# Patient Record
Sex: Male | Born: 1940 | Race: White | Hispanic: No | Marital: Married | Smoking: Never smoker
Health system: Southern US, Community
[De-identification: ages and names within clinical notes are randomized; demographics above are authoritative.]

## PROBLEM LIST (undated history)

## (undated) DIAGNOSIS — H9190 Unspecified hearing loss, unspecified ear: Secondary | ICD-10-CM

## (undated) DIAGNOSIS — N486 Induration penis plastica: Secondary | ICD-10-CM

## (undated) DIAGNOSIS — K219 Gastro-esophageal reflux disease without esophagitis: Secondary | ICD-10-CM

## (undated) DIAGNOSIS — E785 Hyperlipidemia, unspecified: Secondary | ICD-10-CM

## (undated) DIAGNOSIS — Z85820 Personal history of malignant melanoma of skin: Secondary | ICD-10-CM

## (undated) DIAGNOSIS — N182 Chronic kidney disease, stage 2 (mild): Secondary | ICD-10-CM

## (undated) DIAGNOSIS — Z9989 Dependence on other enabling machines and devices: Secondary | ICD-10-CM

## (undated) DIAGNOSIS — Z8639 Personal history of other endocrine, nutritional and metabolic disease: Secondary | ICD-10-CM

## (undated) DIAGNOSIS — G4733 Obstructive sleep apnea (adult) (pediatric): Secondary | ICD-10-CM

## (undated) DIAGNOSIS — N400 Enlarged prostate without lower urinary tract symptoms: Secondary | ICD-10-CM

## (undated) DIAGNOSIS — H401134 Primary open-angle glaucoma, bilateral, indeterminate stage: Secondary | ICD-10-CM

## (undated) DIAGNOSIS — E559 Vitamin D deficiency, unspecified: Secondary | ICD-10-CM

## (undated) DIAGNOSIS — Z961 Presence of intraocular lens: Secondary | ICD-10-CM

## (undated) DIAGNOSIS — I129 Hypertensive chronic kidney disease with stage 1 through stage 4 chronic kidney disease, or unspecified chronic kidney disease: Secondary | ICD-10-CM

## (undated) HISTORY — DX: Gastro-esophageal reflux disease without esophagitis: K21.9

## (undated) HISTORY — DX: Hypertensive chronic kidney disease with stage 1 through stage 4 chronic kidney disease, or unspecified chronic kidney disease: I12.9

## (undated) HISTORY — DX: Induration penis plastica: N48.6

## (undated) HISTORY — DX: Primary open-angle glaucoma, bilateral, indeterminate stage: H40.1134

## (undated) HISTORY — DX: Benign prostatic hyperplasia without lower urinary tract symptoms: N40.0

## (undated) HISTORY — DX: Unspecified hearing loss, unspecified ear: H91.90

## (undated) HISTORY — PX: BASAL CELL CARCINOMA EXCISION: SHX1214

## (undated) HISTORY — DX: Personal history of other endocrine, nutritional and metabolic disease: Z86.39

## (undated) HISTORY — DX: Chronic kidney disease, stage 2 (mild): N18.2

## (undated) HISTORY — DX: Obstructive sleep apnea (adult) (pediatric): G47.33

## (undated) HISTORY — PX: APPENDECTOMY: SHX54

## (undated) HISTORY — PX: CATARACT EXTRACTION, BILATERAL: SHX1313

## (undated) HISTORY — DX: Dependence on other enabling machines and devices: Z99.89

## (undated) HISTORY — DX: Vitamin D deficiency, unspecified: E55.9

## (undated) HISTORY — DX: Personal history of malignant melanoma of skin: Z85.820

## (undated) HISTORY — DX: Hyperlipidemia, unspecified: E78.5

## (undated) HISTORY — DX: Presence of intraocular lens: Z96.1

---

## 1983-10-28 HISTORY — PX: ROTATOR CUFF REPAIR: SHX139

## 2001-09-28 ENCOUNTER — Encounter: Admission: RE | Admit: 2001-09-28 | Discharge: 2001-09-28 | Payer: Self-pay | Admitting: Family Medicine

## 2001-09-28 ENCOUNTER — Encounter: Payer: Self-pay | Admitting: Family Medicine

## 2001-09-28 ENCOUNTER — Ambulatory Visit (HOSPITAL_COMMUNITY): Admission: RE | Admit: 2001-09-28 | Discharge: 2001-09-28 | Payer: Self-pay | Admitting: Family Medicine

## 2002-11-17 ENCOUNTER — Encounter (INDEPENDENT_AMBULATORY_CARE_PROVIDER_SITE_OTHER): Payer: Self-pay | Admitting: *Deleted

## 2002-11-17 ENCOUNTER — Ambulatory Visit (HOSPITAL_BASED_OUTPATIENT_CLINIC_OR_DEPARTMENT_OTHER): Admission: RE | Admit: 2002-11-17 | Discharge: 2002-11-17 | Payer: Self-pay | Admitting: Orthopedic Surgery

## 2004-04-15 ENCOUNTER — Emergency Department (HOSPITAL_COMMUNITY): Admission: EM | Admit: 2004-04-15 | Discharge: 2004-04-16 | Payer: Self-pay | Admitting: Emergency Medicine

## 2004-05-16 ENCOUNTER — Encounter: Admission: RE | Admit: 2004-05-16 | Discharge: 2004-05-16 | Payer: Self-pay | Admitting: Family Medicine

## 2006-11-10 ENCOUNTER — Ambulatory Visit (HOSPITAL_BASED_OUTPATIENT_CLINIC_OR_DEPARTMENT_OTHER): Admission: RE | Admit: 2006-11-10 | Discharge: 2006-11-10 | Payer: Self-pay | Admitting: Family Medicine

## 2007-05-28 HISTORY — PX: HAND SURGERY: SHX662

## 2009-09-12 ENCOUNTER — Ambulatory Visit (HOSPITAL_BASED_OUTPATIENT_CLINIC_OR_DEPARTMENT_OTHER): Admission: RE | Admit: 2009-09-12 | Discharge: 2009-09-12 | Payer: Self-pay | Admitting: Orthopedic Surgery

## 2011-01-29 LAB — POCT HEMOGLOBIN-HEMACUE: Hemoglobin: 14.3 g/dL (ref 13.0–17.0)

## 2011-03-14 NOTE — Procedures (Signed)
NAME:  Andrew Willis, Andrew Willis NO.:  [id number]   MEDICAL RECORD NO.:  [id number]          PATIENT TYPE:  OUT   LOCATION:  SLEEP CENTER                 FACILITY:  [facility]   PHYSICIAN:  Clinton D. Maple Hudson, MD, FCCP, FACPDATE OF BIRTH:  Aug 23[birth year] 1940   DATE OF STUDY:  11/10/2006                            NOCTURNAL POLYSOMNOGRAM   REFERRING PHYSICIAN:  Bradd Canary MD   INDICATION FOR STUDY:  Hypersomnia with sleep apnea.   EPWORTH SLEEPINESS SCORE:  9/24, BMI 23.7, weight 170 pounds.   MEDICATIONS:  Home medications are listed and reviewed.   SLEEP ARCHITECTURE:  Total sleep time 331 minutes with sleep efficiency  74.  Stage I was 11%, stage II 79%, stages III and IV absent, REM 10% of  total sleep time.  Sleep latency 6 minutes, REM latency 270 minutes,  awake after sleep onset 109 minutes, arousal index 15.4.  No bedtime  medications were taken.   RESPIRATORY DATA:  Split study protocol.  Apnea-hypopnea index (AHI,  RDI) 22.7 obstructive events per hour, indicating moderate obstructive  sleep apnea/hypopnea syndrome before CPAP.  This included 47 obstructive  apneas, 4 hypopneas before CPAP.  Events were not positional, although  somewhat more common while supine as expected.  REM AHI 18.7 per hour.  The CPAP was titrated to 12 CWP, AHI 0 per hour.  A medium ResMed Ultra  Mirage full face mask was used with a heated humidifier.   OXYGEN DATA:  Moderate snoring with oxygen desaturation to a nadir of  88% before CPAP.  After CPAP control, saturation held 96% on room air.   CARDIAC DATA:  Sinus rhythm with occasional PAC.   MOVEMENT-PARASOMNIA:  Occasional limb jerk with arousal, insignificant.  Bathroom x3.   IMPRESSIONS-RECOMMENDATIONS:  1. Moderate obstructive sleep apnea/hypopnea syndrome, apnea-hypopnea      index 22.7 per hour without non-positional events, moderate snoring      and oxygen desaturation to a nadir of 88%.  2. Successful CPAP titration to 12 CWP,  apnea-hypopnea index 0 per      hour.  A medium ResMed Ultra Mirage full face mask was used with a      heated humidifier.  3. Bathroom trips times three contributed to sleep fragmentation.      CPAP control of the sleep apnea may correct this.      Clinton D. Maple Hudson, MD, [facility], FACP  Diplomate, Biomedical engineer of Sleep Medicine  Electronically Signed     CDY/MEDQ  D:  11/15/2006 11:04:06  T:  11/15/2006 16:10:96  Job:  [id number]

## 2011-03-14 NOTE — Op Note (Signed)
NAME:  CAITLIN, HILLMER NO.:  [id number]   MEDICAL RECORD NO.:  [id number]                   PATIENT TYPE:  AMB   LOCATION:  DSC                                  FACILITY:  [facility]   PHYSICIAN:  Cindee Salt, M.D.                    DATE OF BIRTH:  1941/02/28   DATE OF PROCEDURE:  11/17/2002  DATE OF DISCHARGE:                                 OPERATIVE REPORT   PREOPERATIVE DIAGNOSIS:  Dupuytren contracture, right ring finger.   POSTOPERATIVE DIAGNOSIS:  Dupuytren contracture, right ring finger.   OPERATION:  Excision of palmar fascia, right ring finger.   SURGEON:  Cindee Salt, M.D.   ASSISTANT:  Alfredo Bach, R.N.   ANESTHESIA:  General.   HISTORY:  The patient is a 70 year old male with a history of a Dupuytren  contracture cord and contracture of his right ring finger.  He desires  removal, being aware of risks and complications.   DESCRIPTION OF PROCEDURE:  The patient was brought to the operating room  where a general anesthetic was carried out without difficulty.  He was  prepped and draped using Duraprep with the right arm free, supine position.  The limb was exsanguinated with an Esmarch bandage.  Tourniquet placed high  on the arm was inflated to 250 mmHg.  A volar zig-zag Brunner-type incision  was made and carried down subcutaneous tissue.  Bleeders were  electrocauterized.  The palmar fascia was identified proximally.  The cord  to the ring finger was identified.  An adjacent cord to the little finger  was also present.  The palmar fascia was released over the carpal  retinaculum.  The neurovascular structures were then identified and  protected, the dissection carried distally.  A spiral band and nerve were  present on the ulnar aspect of the ring finger.  The digital arteries and  nerves were protected on both radial and ulnar sides.  The cord was entirely  removed out to the proximal phalanx without significant difficulty,  protecting the artery and nerve.  On completion, the finger came immediately  straight.  Neurovascular structures were entirely intact.  The wound was  copiously irrigated with saline.  A double vessel loop drain was placed and  the skin closed with interrupted 5-0 nylon sutures.  A sterile compressive  dressing and splint were applied.  The patient tolerated the procedure well.  On deflation of the tourniquet, all fingers immediately pinked and he was  taken to the recovery room for observation in satisfactory condition.   He is discharged home to return to the [facility] in one week  on Vicodin and Keflex.  Cindee Salt, M.D.    Angelique Blonder  D:  11/17/2002  T:  11/17/2002  Job:  [id number]

## 2013-03-19 ENCOUNTER — Emergency Department (HOSPITAL_COMMUNITY)
Admission: EM | Admit: 2013-03-19 | Discharge: 2013-03-20 | Disposition: A | Discharge status: Home / Self Care | Payer: Medicare Other | Attending: Emergency Medicine | Admitting: Emergency Medicine

## 2013-03-19 DIAGNOSIS — S61509A Unspecified open wound of unspecified wrist, initial encounter: Secondary | ICD-10-CM | POA: Insufficient documentation

## 2013-03-19 DIAGNOSIS — Z79899 Other long term (current) drug therapy: Secondary | ICD-10-CM | POA: Insufficient documentation

## 2013-03-19 DIAGNOSIS — W540XXA Bitten by dog, initial encounter: Secondary | ICD-10-CM | POA: Insufficient documentation

## 2013-03-19 DIAGNOSIS — Y9389 Activity, other specified: Secondary | ICD-10-CM | POA: Insufficient documentation

## 2013-03-19 DIAGNOSIS — S41152A Open bite of left upper arm, initial encounter: Secondary | ICD-10-CM

## 2013-03-19 DIAGNOSIS — Y9289 Other specified places as the place of occurrence of the external cause: Secondary | ICD-10-CM | POA: Insufficient documentation

## 2013-03-19 DIAGNOSIS — Z7982 Long term (current) use of aspirin: Secondary | ICD-10-CM | POA: Insufficient documentation

## 2013-03-19 DIAGNOSIS — S51809A Unspecified open wound of unspecified forearm, initial encounter: Secondary | ICD-10-CM | POA: Insufficient documentation

## 2013-03-19 MED ORDER — IBUPROFEN 800 MG PO TABS: 800.0000 mg | ORAL_TABLET | Freq: Three times a day (TID) | ORAL | Status: DC | PRN

## 2013-03-19 MED ORDER — AMOXICILLIN-POT CLAVULANATE 875-125 MG PO TABS: 1.0000 | ORAL_TABLET | Freq: Two times a day (BID) | ORAL | Status: DC

## 2013-03-19 NOTE — ED Provider Notes (Signed)
History     CSN: [id number]  Arrival date & time 03/19/13  2209   First MD Initiated Contact with Patient 03/19/13 2235      Chief Complaint  Patient presents with  . Animal Bite   HPI  History provided by the patient. Patient is a 72 year old male who presents with injuries after a dog bite. Patient states he was taking care of his son's dog who has a collar with an invisible electric fence shocking system. The dog had crossed the boundary of this and was receiving a shock from a collar and the patient was attempting to grab the dog when it struck out and bit his left forearm and wrist area. Patient has several puncture wounds small amount of bleeding that was controlled with pressure and bandage. Patient does report feeling slightly excited and jittery after the injury with some lightheadedness. He did not have any syncopal episode. He was able to drive himself to the emergency room and has some improvements. He denies significant pain to the bite marks. Denies any weakness or numbness to the hand or fingers. Patient believes his current on his tendon shot.    No past medical history on file.  No past surgical history on file.  No family history on file.  History  Substance Use Topics  . Smoking status: Not on file  . Smokeless tobacco: Not on file  . Alcohol Use: Not on file      Review of Systems  Neurological: Negative for weakness and numbness.  All other systems reviewed and are negative.    Allergies  Review of patient's allergies indicates no known allergies.  Home Medications   Current Outpatient Rx  Name  Route  Sig  Dispense  Refill  . aspirin EC 81 MG tablet   Oral   Take 81 mg by mouth at bedtime.         Marland Kitchen atorvastatin (LIPITOR) 10 MG tablet   Oral   Take 5 mg by mouth daily.         . cholecalciferol (VITAMIN D) 1000 UNITS tablet   Oral   Take 2,000 Units by mouth daily.         Marland Kitchen esomeprazole (NEXIUM) 20 MG capsule   Oral   Take 20 mg  by mouth daily before breakfast.         . finasteride (PROSCAR) 5 MG tablet   Oral   Take 5 mg by mouth daily.         . timolol (BETIMOL) 0.25 % ophthalmic solution   Both Eyes   Place 1 drop into both eyes 2 (two) times daily.           BP 162/70  Pulse 67  Temp(Src) 97.9 F (36.6 C) (Oral)  Resp 14  SpO2 99%  Physical Exam  Nursing note and vitals reviewed. Constitutional: He is oriented to person, place, and time. He appears well-developed and well-nourished. No distress.  HENT:  Head: Normocephalic.  Cardiovascular: Normal rate and regular rhythm.   No murmur heard. Pulmonary/Chest: Effort normal and breath sounds normal. No respiratory distress. He has no wheezes. He has no rales.  Musculoskeletal: Normal range of motion.  Several small puncture wounds to the left wrist and forearm. Wound consistent with bite mark from a dog. Patient has full range of motion and strength against resistance of the wrist and all digits. He has normal distal sensation to light touch to all digits. Normal cap refill less than 2  seconds. No active bleeding. The wounds were explored without signs of foreign body or broken teeth.  Neurological: He is alert and oriented to person, place, and time.  Skin: Skin is warm.  Psychiatric: He has a normal mood and affect. His behavior is normal.    ED Course  Procedures      1. Dog bite of arm, left, initial encounter       MDM  Patient seen and evaluated. Patient resting appears about this time no acute distress. Several puncture wounds to the left forearm and wrist. No signs of neurovascular deficit. Normal strength and range of motion. Patient is current on tetanus.        Angus Seller, PA-C 03/20/13 [phone]

## 2013-03-19 NOTE — ED Notes (Signed)
Pt bit by family pet when he accidentally got to close to electric fence and electrocuted dog,  Dog bit him in response to shocking

## 2013-03-19 NOTE — ED Notes (Signed)
Pt was taking care of son's dog and he accidentally got dog close to electric fence unknowingly electrocuted dog and dog bit him in defense.  The dog is up to date on all shots.  Pt is feeling faint, he is tearful,  Injury to left forearm,  Pt wrapped arm in towel prior to arrival and then drove himself here for treatment

## 2013-03-20 NOTE — ED Provider Notes (Signed)
Medical screening examination/treatment/procedure(s) were performed by non-physician practitioner and as supervising physician I was immediately available for consultation/collaboration.     Vida Roller, MD 03/20/13 1242

## 2014-10-27 DIAGNOSIS — I129 Hypertensive chronic kidney disease with stage 1 through stage 4 chronic kidney disease, or unspecified chronic kidney disease: Secondary | ICD-10-CM

## 2014-10-27 DIAGNOSIS — Z8639 Personal history of other endocrine, nutritional and metabolic disease: Secondary | ICD-10-CM

## 2014-10-27 HISTORY — DX: Hypertensive chronic kidney disease with stage 1 through stage 4 chronic kidney disease, or unspecified chronic kidney disease: I12.9

## 2014-10-27 HISTORY — DX: Personal history of other endocrine, nutritional and metabolic disease: Z86.39

## 2015-02-19 ENCOUNTER — Other Ambulatory Visit: Payer: Self-pay | Admitting: Internal Medicine

## 2015-02-19 DIAGNOSIS — M25551 Pain in right hip: Secondary | ICD-10-CM

## 2015-03-08 ENCOUNTER — Other Ambulatory Visit: Payer: Self-pay

## 2015-03-15 ENCOUNTER — Ambulatory Visit
Admission: RE | Admit: 2015-03-15 | Discharge: 2015-03-15 | Disposition: A | Discharge status: Home / Self Care | Payer: Medicare HMO | Source: Ambulatory Visit | Attending: Internal Medicine | Admitting: Internal Medicine

## 2015-03-15 DIAGNOSIS — M25551 Pain in right hip: Secondary | ICD-10-CM

## 2015-08-20 DIAGNOSIS — M720 Palmar fascial fibromatosis [Dupuytren]: Secondary | ICD-10-CM | POA: Diagnosis not present

## 2015-10-05 DIAGNOSIS — H401131 Primary open-angle glaucoma, bilateral, mild stage: Secondary | ICD-10-CM | POA: Diagnosis not present

## 2015-10-05 DIAGNOSIS — H04129 Dry eye syndrome of unspecified lacrimal gland: Secondary | ICD-10-CM | POA: Diagnosis not present

## 2015-10-05 DIAGNOSIS — H25013 Cortical age-related cataract, bilateral: Secondary | ICD-10-CM | POA: Diagnosis not present

## 2015-10-05 DIAGNOSIS — M7711 Lateral epicondylitis, right elbow: Secondary | ICD-10-CM | POA: Diagnosis not present

## 2015-10-05 DIAGNOSIS — H2513 Age-related nuclear cataract, bilateral: Secondary | ICD-10-CM | POA: Diagnosis not present

## 2015-10-05 DIAGNOSIS — M7701 Medial epicondylitis, right elbow: Secondary | ICD-10-CM | POA: Diagnosis not present

## 2015-10-05 DIAGNOSIS — H401113 Primary open-angle glaucoma, right eye, severe stage: Secondary | ICD-10-CM | POA: Diagnosis not present

## 2015-10-10 ENCOUNTER — Other Ambulatory Visit: Payer: Self-pay | Admitting: Urology

## 2015-10-10 DIAGNOSIS — Z0000 Encounter for general adult medical examination without abnormal findings: Secondary | ICD-10-CM | POA: Diagnosis not present

## 2015-10-10 DIAGNOSIS — N182 Chronic kidney disease, stage 2 (mild): Secondary | ICD-10-CM | POA: Diagnosis not present

## 2015-10-10 DIAGNOSIS — Z125 Encounter for screening for malignant neoplasm of prostate: Secondary | ICD-10-CM | POA: Diagnosis not present

## 2015-10-10 DIAGNOSIS — E1122 Type 2 diabetes mellitus with diabetic chronic kidney disease: Secondary | ICD-10-CM | POA: Diagnosis not present

## 2015-10-10 DIAGNOSIS — E559 Vitamin D deficiency, unspecified: Secondary | ICD-10-CM | POA: Diagnosis not present

## 2015-10-10 DIAGNOSIS — N390 Urinary tract infection, site not specified: Secondary | ICD-10-CM | POA: Diagnosis not present

## 2015-10-10 DIAGNOSIS — N401 Enlarged prostate with lower urinary tract symptoms: Secondary | ICD-10-CM | POA: Diagnosis not present

## 2015-10-10 DIAGNOSIS — N200 Calculus of kidney: Secondary | ICD-10-CM | POA: Diagnosis not present

## 2015-10-10 DIAGNOSIS — N281 Cyst of kidney, acquired: Secondary | ICD-10-CM

## 2015-10-10 DIAGNOSIS — Z1389 Encounter for screening for other disorder: Secondary | ICD-10-CM | POA: Diagnosis not present

## 2015-10-10 DIAGNOSIS — R351 Nocturia: Secondary | ICD-10-CM | POA: Diagnosis not present

## 2015-10-10 DIAGNOSIS — I10 Essential (primary) hypertension: Secondary | ICD-10-CM | POA: Diagnosis not present

## 2015-10-10 DIAGNOSIS — E1121 Type 2 diabetes mellitus with diabetic nephropathy: Secondary | ICD-10-CM | POA: Diagnosis not present

## 2015-10-17 DIAGNOSIS — I129 Hypertensive chronic kidney disease with stage 1 through stage 4 chronic kidney disease, or unspecified chronic kidney disease: Secondary | ICD-10-CM | POA: Diagnosis not present

## 2015-10-17 DIAGNOSIS — E1122 Type 2 diabetes mellitus with diabetic chronic kidney disease: Secondary | ICD-10-CM | POA: Diagnosis not present

## 2015-10-17 DIAGNOSIS — N182 Chronic kidney disease, stage 2 (mild): Secondary | ICD-10-CM | POA: Diagnosis not present

## 2015-10-17 DIAGNOSIS — E785 Hyperlipidemia, unspecified: Secondary | ICD-10-CM | POA: Diagnosis not present

## 2015-10-17 DIAGNOSIS — N400 Enlarged prostate without lower urinary tract symptoms: Secondary | ICD-10-CM | POA: Diagnosis not present

## 2015-10-18 ENCOUNTER — Ambulatory Visit
Admission: RE | Admit: 2015-10-18 | Discharge: 2015-10-18 | Disposition: A | Discharge status: Home / Self Care | Payer: Medicare HMO | Source: Ambulatory Visit | Attending: Urology | Admitting: Urology

## 2015-10-18 DIAGNOSIS — N281 Cyst of kidney, acquired: Secondary | ICD-10-CM

## 2015-10-18 DIAGNOSIS — N2889 Other specified disorders of kidney and ureter: Secondary | ICD-10-CM | POA: Diagnosis not present

## 2015-10-18 MED ORDER — GADOBENATE DIMEGLUMINE 529 MG/ML IV SOLN
17.0000 mL | Freq: Once | INTRAVENOUS | Status: AC | PRN
  Administered 2015-10-18: 17 mL via INTRAVENOUS

## 2015-10-31 DIAGNOSIS — H35363 Drusen (degenerative) of macula, bilateral: Secondary | ICD-10-CM | POA: Diagnosis not present

## 2015-10-31 DIAGNOSIS — H25813 Combined forms of age-related cataract, bilateral: Secondary | ICD-10-CM | POA: Diagnosis not present

## 2015-10-31 DIAGNOSIS — H401131 Primary open-angle glaucoma, bilateral, mild stage: Secondary | ICD-10-CM | POA: Diagnosis not present

## 2015-10-31 DIAGNOSIS — H1851 Endothelial corneal dystrophy: Secondary | ICD-10-CM | POA: Diagnosis not present

## 2015-11-15 DIAGNOSIS — H1851 Endothelial corneal dystrophy: Secondary | ICD-10-CM | POA: Diagnosis not present

## 2015-11-15 DIAGNOSIS — H401131 Primary open-angle glaucoma, bilateral, mild stage: Secondary | ICD-10-CM | POA: Diagnosis not present

## 2015-11-15 DIAGNOSIS — H25813 Combined forms of age-related cataract, bilateral: Secondary | ICD-10-CM | POA: Diagnosis not present

## 2015-11-29 DIAGNOSIS — G4733 Obstructive sleep apnea (adult) (pediatric): Secondary | ICD-10-CM | POA: Diagnosis not present

## 2015-11-29 DIAGNOSIS — H25811 Combined forms of age-related cataract, right eye: Secondary | ICD-10-CM | POA: Diagnosis not present

## 2015-11-29 DIAGNOSIS — H401111 Primary open-angle glaucoma, right eye, mild stage: Secondary | ICD-10-CM | POA: Diagnosis not present

## 2015-11-29 DIAGNOSIS — H401121 Primary open-angle glaucoma, left eye, mild stage: Secondary | ICD-10-CM | POA: Diagnosis not present

## 2015-11-29 DIAGNOSIS — H2512 Age-related nuclear cataract, left eye: Secondary | ICD-10-CM | POA: Diagnosis not present

## 2015-12-03 DIAGNOSIS — M7711 Lateral epicondylitis, right elbow: Secondary | ICD-10-CM | POA: Diagnosis not present

## 2015-12-03 DIAGNOSIS — M25521 Pain in right elbow: Secondary | ICD-10-CM | POA: Diagnosis not present

## 2015-12-03 DIAGNOSIS — M7701 Medial epicondylitis, right elbow: Secondary | ICD-10-CM | POA: Diagnosis not present

## 2015-12-03 DIAGNOSIS — H25811 Combined forms of age-related cataract, right eye: Secondary | ICD-10-CM | POA: Diagnosis not present

## 2015-12-03 DIAGNOSIS — R2231 Localized swelling, mass and lump, right upper limb: Secondary | ICD-10-CM | POA: Diagnosis not present

## 2015-12-06 DIAGNOSIS — G4733 Obstructive sleep apnea (adult) (pediatric): Secondary | ICD-10-CM | POA: Diagnosis not present

## 2015-12-06 DIAGNOSIS — H401111 Primary open-angle glaucoma, right eye, mild stage: Secondary | ICD-10-CM | POA: Diagnosis not present

## 2015-12-06 DIAGNOSIS — H2511 Age-related nuclear cataract, right eye: Secondary | ICD-10-CM | POA: Diagnosis not present

## 2015-12-06 DIAGNOSIS — H401131 Primary open-angle glaucoma, bilateral, mild stage: Secondary | ICD-10-CM | POA: Diagnosis not present

## 2016-02-21 DIAGNOSIS — G4733 Obstructive sleep apnea (adult) (pediatric): Secondary | ICD-10-CM | POA: Diagnosis not present

## 2016-02-22 DIAGNOSIS — Z85820 Personal history of malignant melanoma of skin: Secondary | ICD-10-CM | POA: Diagnosis not present

## 2016-02-22 DIAGNOSIS — Z08 Encounter for follow-up examination after completed treatment for malignant neoplasm: Secondary | ICD-10-CM | POA: Diagnosis not present

## 2016-02-22 DIAGNOSIS — D225 Melanocytic nevi of trunk: Secondary | ICD-10-CM | POA: Diagnosis not present

## 2016-02-22 DIAGNOSIS — Z1283 Encounter for screening for malignant neoplasm of skin: Secondary | ICD-10-CM | POA: Diagnosis not present

## 2016-04-09 DIAGNOSIS — E785 Hyperlipidemia, unspecified: Secondary | ICD-10-CM | POA: Diagnosis not present

## 2016-04-09 DIAGNOSIS — E1122 Type 2 diabetes mellitus with diabetic chronic kidney disease: Secondary | ICD-10-CM | POA: Diagnosis not present

## 2016-04-09 DIAGNOSIS — E559 Vitamin D deficiency, unspecified: Secondary | ICD-10-CM | POA: Diagnosis not present

## 2016-04-09 DIAGNOSIS — I129 Hypertensive chronic kidney disease with stage 1 through stage 4 chronic kidney disease, or unspecified chronic kidney disease: Secondary | ICD-10-CM | POA: Diagnosis not present

## 2016-04-09 DIAGNOSIS — N400 Enlarged prostate without lower urinary tract symptoms: Secondary | ICD-10-CM | POA: Diagnosis not present

## 2016-04-11 DIAGNOSIS — H401134 Primary open-angle glaucoma, bilateral, indeterminate stage: Secondary | ICD-10-CM | POA: Diagnosis not present

## 2016-04-11 DIAGNOSIS — Z961 Presence of intraocular lens: Secondary | ICD-10-CM

## 2016-04-11 HISTORY — DX: Presence of intraocular lens: Z96.1

## 2016-04-11 HISTORY — DX: Primary open-angle glaucoma, bilateral, indeterminate stage: H40.1134

## 2016-04-15 DIAGNOSIS — I129 Hypertensive chronic kidney disease with stage 1 through stage 4 chronic kidney disease, or unspecified chronic kidney disease: Secondary | ICD-10-CM | POA: Diagnosis not present

## 2016-04-15 DIAGNOSIS — N182 Chronic kidney disease, stage 2 (mild): Secondary | ICD-10-CM | POA: Diagnosis not present

## 2016-04-15 DIAGNOSIS — E785 Hyperlipidemia, unspecified: Secondary | ICD-10-CM | POA: Diagnosis not present

## 2016-04-15 DIAGNOSIS — E1122 Type 2 diabetes mellitus with diabetic chronic kidney disease: Secondary | ICD-10-CM | POA: Diagnosis not present

## 2016-06-03 DIAGNOSIS — C44212 Basal cell carcinoma of skin of right ear and external auricular canal: Secondary | ICD-10-CM | POA: Diagnosis not present

## 2016-06-14 DIAGNOSIS — R69 Illness, unspecified: Secondary | ICD-10-CM | POA: Diagnosis not present

## 2016-06-24 DIAGNOSIS — H35363 Drusen (degenerative) of macula, bilateral: Secondary | ICD-10-CM | POA: Diagnosis not present

## 2016-06-24 DIAGNOSIS — H26493 Other secondary cataract, bilateral: Secondary | ICD-10-CM | POA: Diagnosis not present

## 2016-06-26 DIAGNOSIS — M25522 Pain in left elbow: Secondary | ICD-10-CM | POA: Diagnosis not present

## 2016-06-26 DIAGNOSIS — M7712 Lateral epicondylitis, left elbow: Secondary | ICD-10-CM | POA: Diagnosis not present

## 2016-06-26 DIAGNOSIS — M25521 Pain in right elbow: Secondary | ICD-10-CM | POA: Diagnosis not present

## 2016-06-26 DIAGNOSIS — M7711 Lateral epicondylitis, right elbow: Secondary | ICD-10-CM | POA: Diagnosis not present

## 2016-08-05 DIAGNOSIS — L718 Other rosacea: Secondary | ICD-10-CM | POA: Diagnosis not present

## 2016-08-05 DIAGNOSIS — Z85828 Personal history of other malignant neoplasm of skin: Secondary | ICD-10-CM | POA: Diagnosis not present

## 2016-08-05 DIAGNOSIS — Z08 Encounter for follow-up examination after completed treatment for malignant neoplasm: Secondary | ICD-10-CM | POA: Diagnosis not present

## 2016-09-01 DIAGNOSIS — T859XXA Unspecified complication of internal prosthetic device, implant and graft, initial encounter: Secondary | ICD-10-CM | POA: Diagnosis not present

## 2016-09-01 DIAGNOSIS — H401131 Primary open-angle glaucoma, bilateral, mild stage: Secondary | ICD-10-CM | POA: Diagnosis not present

## 2016-09-01 DIAGNOSIS — H35363 Drusen (degenerative) of macula, bilateral: Secondary | ICD-10-CM | POA: Diagnosis not present

## 2016-09-01 DIAGNOSIS — H26493 Other secondary cataract, bilateral: Secondary | ICD-10-CM | POA: Diagnosis not present

## 2016-09-02 DIAGNOSIS — M7662 Achilles tendinitis, left leg: Secondary | ICD-10-CM | POA: Diagnosis not present

## 2016-09-02 DIAGNOSIS — M7661 Achilles tendinitis, right leg: Secondary | ICD-10-CM | POA: Diagnosis not present

## 2016-09-09 DIAGNOSIS — M25571 Pain in right ankle and joints of right foot: Secondary | ICD-10-CM | POA: Diagnosis not present

## 2016-09-09 DIAGNOSIS — M25572 Pain in left ankle and joints of left foot: Secondary | ICD-10-CM | POA: Diagnosis not present

## 2016-09-09 DIAGNOSIS — M7661 Achilles tendinitis, right leg: Secondary | ICD-10-CM | POA: Diagnosis not present

## 2016-09-09 DIAGNOSIS — M7662 Achilles tendinitis, left leg: Secondary | ICD-10-CM | POA: Diagnosis not present

## 2016-09-22 DIAGNOSIS — H1131 Conjunctival hemorrhage, right eye: Secondary | ICD-10-CM | POA: Diagnosis not present

## 2016-09-22 DIAGNOSIS — T859XXA Unspecified complication of internal prosthetic device, implant and graft, initial encounter: Secondary | ICD-10-CM | POA: Diagnosis not present

## 2016-09-24 DIAGNOSIS — H25811 Combined forms of age-related cataract, right eye: Secondary | ICD-10-CM | POA: Diagnosis not present

## 2016-09-24 DIAGNOSIS — G4733 Obstructive sleep apnea (adult) (pediatric): Secondary | ICD-10-CM | POA: Diagnosis not present

## 2016-09-24 DIAGNOSIS — T8529XA Other mechanical complication of intraocular lens, initial encounter: Secondary | ICD-10-CM | POA: Diagnosis not present

## 2016-09-24 DIAGNOSIS — T859XXA Unspecified complication of internal prosthetic device, implant and graft, initial encounter: Secondary | ICD-10-CM | POA: Diagnosis not present

## 2016-10-09 DIAGNOSIS — E559 Vitamin D deficiency, unspecified: Secondary | ICD-10-CM | POA: Diagnosis not present

## 2016-10-09 DIAGNOSIS — Z0000 Encounter for general adult medical examination without abnormal findings: Secondary | ICD-10-CM | POA: Diagnosis not present

## 2016-10-09 DIAGNOSIS — I129 Hypertensive chronic kidney disease with stage 1 through stage 4 chronic kidney disease, or unspecified chronic kidney disease: Secondary | ICD-10-CM | POA: Diagnosis not present

## 2016-10-09 DIAGNOSIS — E1122 Type 2 diabetes mellitus with diabetic chronic kidney disease: Secondary | ICD-10-CM | POA: Diagnosis not present

## 2016-10-09 DIAGNOSIS — E785 Hyperlipidemia, unspecified: Secondary | ICD-10-CM | POA: Diagnosis not present

## 2016-10-09 DIAGNOSIS — Z125 Encounter for screening for malignant neoplasm of prostate: Secondary | ICD-10-CM | POA: Diagnosis not present

## 2016-10-13 ENCOUNTER — Other Ambulatory Visit: Payer: Self-pay | Admitting: Urology

## 2016-10-13 DIAGNOSIS — N200 Calculus of kidney: Secondary | ICD-10-CM | POA: Diagnosis not present

## 2016-10-13 DIAGNOSIS — R351 Nocturia: Secondary | ICD-10-CM | POA: Diagnosis not present

## 2016-10-13 DIAGNOSIS — D49512 Neoplasm of unspecified behavior of left kidney: Secondary | ICD-10-CM

## 2016-10-13 DIAGNOSIS — N401 Enlarged prostate with lower urinary tract symptoms: Secondary | ICD-10-CM | POA: Diagnosis not present

## 2016-10-16 DIAGNOSIS — N400 Enlarged prostate without lower urinary tract symptoms: Secondary | ICD-10-CM | POA: Diagnosis not present

## 2016-10-16 DIAGNOSIS — N182 Chronic kidney disease, stage 2 (mild): Secondary | ICD-10-CM | POA: Diagnosis not present

## 2016-10-16 DIAGNOSIS — E785 Hyperlipidemia, unspecified: Secondary | ICD-10-CM | POA: Diagnosis not present

## 2016-10-16 DIAGNOSIS — E1122 Type 2 diabetes mellitus with diabetic chronic kidney disease: Secondary | ICD-10-CM | POA: Diagnosis not present

## 2016-10-21 ENCOUNTER — Ambulatory Visit (HOSPITAL_COMMUNITY)
Admission: RE | Admit: 2016-10-21 | Discharge: 2016-10-21 | Disposition: A | Discharge status: Home / Self Care | Payer: Medicare HMO | Source: Ambulatory Visit | Attending: Urology | Admitting: Urology

## 2016-10-21 DIAGNOSIS — N289 Disorder of kidney and ureter, unspecified: Secondary | ICD-10-CM | POA: Diagnosis not present

## 2016-10-21 DIAGNOSIS — K8020 Calculus of gallbladder without cholecystitis without obstruction: Secondary | ICD-10-CM | POA: Diagnosis not present

## 2016-10-21 DIAGNOSIS — N281 Cyst of kidney, acquired: Secondary | ICD-10-CM | POA: Diagnosis not present

## 2016-10-21 DIAGNOSIS — D49512 Neoplasm of unspecified behavior of left kidney: Secondary | ICD-10-CM | POA: Diagnosis present

## 2016-10-21 LAB — POCT I-STAT CREATININE: Creatinine, Ser: 0.9 mg/dL (ref 0.61–1.24)

## 2016-10-21 MED ORDER — GADOBENATE DIMEGLUMINE 529 MG/ML IV SOLN
20.0000 mL | Freq: Once | INTRAVENOUS | Status: DC | PRN
Start: 2016-10-21 — End: 2016-10-22

## 2016-11-24 DIAGNOSIS — T859XXA Unspecified complication of internal prosthetic device, implant and graft, initial encounter: Secondary | ICD-10-CM | POA: Diagnosis not present

## 2016-11-24 DIAGNOSIS — H401131 Primary open-angle glaucoma, bilateral, mild stage: Secondary | ICD-10-CM | POA: Diagnosis not present

## 2016-11-24 DIAGNOSIS — H26491 Other secondary cataract, right eye: Secondary | ICD-10-CM | POA: Diagnosis not present

## 2016-12-03 DIAGNOSIS — T859XXA Unspecified complication of internal prosthetic device, implant and graft, initial encounter: Secondary | ICD-10-CM | POA: Diagnosis not present

## 2017-01-12 DIAGNOSIS — M7712 Lateral epicondylitis, left elbow: Secondary | ICD-10-CM | POA: Diagnosis not present

## 2017-01-12 DIAGNOSIS — M25522 Pain in left elbow: Secondary | ICD-10-CM | POA: Diagnosis not present

## 2017-01-12 DIAGNOSIS — M25521 Pain in right elbow: Secondary | ICD-10-CM | POA: Diagnosis not present

## 2017-01-12 DIAGNOSIS — R2231 Localized swelling, mass and lump, right upper limb: Secondary | ICD-10-CM | POA: Diagnosis not present

## 2017-01-12 DIAGNOSIS — M7711 Lateral epicondylitis, right elbow: Secondary | ICD-10-CM | POA: Diagnosis not present

## 2017-01-12 DIAGNOSIS — M7701 Medial epicondylitis, right elbow: Secondary | ICD-10-CM | POA: Diagnosis not present

## 2017-01-12 DIAGNOSIS — R2232 Localized swelling, mass and lump, left upper limb: Secondary | ICD-10-CM | POA: Diagnosis not present

## 2017-01-21 DIAGNOSIS — R2232 Localized swelling, mass and lump, left upper limb: Secondary | ICD-10-CM | POA: Diagnosis not present

## 2017-01-21 DIAGNOSIS — T8529XA Other mechanical complication of intraocular lens, initial encounter: Secondary | ICD-10-CM | POA: Diagnosis not present

## 2017-01-21 DIAGNOSIS — M7712 Lateral epicondylitis, left elbow: Secondary | ICD-10-CM | POA: Diagnosis not present

## 2017-01-21 DIAGNOSIS — M25521 Pain in right elbow: Secondary | ICD-10-CM | POA: Diagnosis not present

## 2017-01-21 DIAGNOSIS — T859XXA Unspecified complication of internal prosthetic device, implant and graft, initial encounter: Secondary | ICD-10-CM | POA: Diagnosis not present

## 2017-01-21 DIAGNOSIS — M7711 Lateral epicondylitis, right elbow: Secondary | ICD-10-CM | POA: Diagnosis not present

## 2017-01-21 DIAGNOSIS — M7701 Medial epicondylitis, right elbow: Secondary | ICD-10-CM | POA: Diagnosis not present

## 2017-01-21 DIAGNOSIS — G4733 Obstructive sleep apnea (adult) (pediatric): Secondary | ICD-10-CM | POA: Diagnosis not present

## 2017-01-21 DIAGNOSIS — R2231 Localized swelling, mass and lump, right upper limb: Secondary | ICD-10-CM | POA: Diagnosis not present

## 2017-01-21 DIAGNOSIS — M25522 Pain in left elbow: Secondary | ICD-10-CM | POA: Diagnosis not present

## 2017-01-28 DIAGNOSIS — M7701 Medial epicondylitis, right elbow: Secondary | ICD-10-CM | POA: Diagnosis not present

## 2017-01-28 DIAGNOSIS — M7711 Lateral epicondylitis, right elbow: Secondary | ICD-10-CM | POA: Diagnosis not present

## 2017-01-28 DIAGNOSIS — R2232 Localized swelling, mass and lump, left upper limb: Secondary | ICD-10-CM | POA: Diagnosis not present

## 2017-01-28 DIAGNOSIS — R2231 Localized swelling, mass and lump, right upper limb: Secondary | ICD-10-CM | POA: Diagnosis not present

## 2017-01-28 DIAGNOSIS — M7712 Lateral epicondylitis, left elbow: Secondary | ICD-10-CM | POA: Diagnosis not present

## 2017-01-28 DIAGNOSIS — M25522 Pain in left elbow: Secondary | ICD-10-CM | POA: Diagnosis not present

## 2017-01-28 DIAGNOSIS — M25521 Pain in right elbow: Secondary | ICD-10-CM | POA: Diagnosis not present

## 2017-02-04 DIAGNOSIS — M25522 Pain in left elbow: Secondary | ICD-10-CM | POA: Diagnosis not present

## 2017-02-04 DIAGNOSIS — M7711 Lateral epicondylitis, right elbow: Secondary | ICD-10-CM | POA: Diagnosis not present

## 2017-02-04 DIAGNOSIS — M7701 Medial epicondylitis, right elbow: Secondary | ICD-10-CM | POA: Diagnosis not present

## 2017-02-04 DIAGNOSIS — M7712 Lateral epicondylitis, left elbow: Secondary | ICD-10-CM | POA: Diagnosis not present

## 2017-02-04 DIAGNOSIS — M25521 Pain in right elbow: Secondary | ICD-10-CM | POA: Diagnosis not present

## 2017-02-11 DIAGNOSIS — M7662 Achilles tendinitis, left leg: Secondary | ICD-10-CM | POA: Diagnosis not present

## 2017-02-11 DIAGNOSIS — M7701 Medial epicondylitis, right elbow: Secondary | ICD-10-CM | POA: Diagnosis not present

## 2017-02-11 DIAGNOSIS — H02054 Trichiasis without entropian left upper eyelid: Secondary | ICD-10-CM | POA: Diagnosis not present

## 2017-02-11 DIAGNOSIS — M7711 Lateral epicondylitis, right elbow: Secondary | ICD-10-CM | POA: Diagnosis not present

## 2017-02-11 DIAGNOSIS — H02055 Trichiasis without entropian left lower eyelid: Secondary | ICD-10-CM | POA: Diagnosis not present

## 2017-02-11 DIAGNOSIS — M7661 Achilles tendinitis, right leg: Secondary | ICD-10-CM | POA: Diagnosis not present

## 2017-02-11 DIAGNOSIS — M7712 Lateral epicondylitis, left elbow: Secondary | ICD-10-CM | POA: Diagnosis not present

## 2017-02-26 DIAGNOSIS — M9901 Segmental and somatic dysfunction of cervical region: Secondary | ICD-10-CM | POA: Diagnosis not present

## 2017-02-26 DIAGNOSIS — M9902 Segmental and somatic dysfunction of thoracic region: Secondary | ICD-10-CM | POA: Diagnosis not present

## 2017-02-26 DIAGNOSIS — M791 Myalgia: Secondary | ICD-10-CM | POA: Diagnosis not present

## 2017-02-26 DIAGNOSIS — S134XXA Sprain of ligaments of cervical spine, initial encounter: Secondary | ICD-10-CM | POA: Diagnosis not present

## 2017-03-02 DIAGNOSIS — M9902 Segmental and somatic dysfunction of thoracic region: Secondary | ICD-10-CM | POA: Diagnosis not present

## 2017-03-02 DIAGNOSIS — M9901 Segmental and somatic dysfunction of cervical region: Secondary | ICD-10-CM | POA: Diagnosis not present

## 2017-03-02 DIAGNOSIS — M791 Myalgia: Secondary | ICD-10-CM | POA: Diagnosis not present

## 2017-03-02 DIAGNOSIS — S134XXA Sprain of ligaments of cervical spine, initial encounter: Secondary | ICD-10-CM | POA: Diagnosis not present

## 2017-03-09 DIAGNOSIS — S134XXA Sprain of ligaments of cervical spine, initial encounter: Secondary | ICD-10-CM | POA: Diagnosis not present

## 2017-03-09 DIAGNOSIS — M9901 Segmental and somatic dysfunction of cervical region: Secondary | ICD-10-CM | POA: Diagnosis not present

## 2017-03-09 DIAGNOSIS — M9902 Segmental and somatic dysfunction of thoracic region: Secondary | ICD-10-CM | POA: Diagnosis not present

## 2017-03-09 DIAGNOSIS — M791 Myalgia: Secondary | ICD-10-CM | POA: Diagnosis not present

## 2017-03-10 DIAGNOSIS — D225 Melanocytic nevi of trunk: Secondary | ICD-10-CM | POA: Diagnosis not present

## 2017-03-10 DIAGNOSIS — Z85820 Personal history of malignant melanoma of skin: Secondary | ICD-10-CM | POA: Diagnosis not present

## 2017-03-10 DIAGNOSIS — I781 Nevus, non-neoplastic: Secondary | ICD-10-CM | POA: Diagnosis not present

## 2017-03-10 DIAGNOSIS — Z1283 Encounter for screening for malignant neoplasm of skin: Secondary | ICD-10-CM | POA: Diagnosis not present

## 2017-03-10 DIAGNOSIS — Z08 Encounter for follow-up examination after completed treatment for malignant neoplasm: Secondary | ICD-10-CM | POA: Diagnosis not present

## 2017-03-10 DIAGNOSIS — I788 Other diseases of capillaries: Secondary | ICD-10-CM | POA: Diagnosis not present

## 2017-03-10 DIAGNOSIS — D485 Neoplasm of uncertain behavior of skin: Secondary | ICD-10-CM | POA: Diagnosis not present

## 2017-03-10 DIAGNOSIS — Z85828 Personal history of other malignant neoplasm of skin: Secondary | ICD-10-CM | POA: Diagnosis not present

## 2017-03-20 DIAGNOSIS — M9901 Segmental and somatic dysfunction of cervical region: Secondary | ICD-10-CM | POA: Diagnosis not present

## 2017-03-20 DIAGNOSIS — S134XXA Sprain of ligaments of cervical spine, initial encounter: Secondary | ICD-10-CM | POA: Diagnosis not present

## 2017-03-20 DIAGNOSIS — M9902 Segmental and somatic dysfunction of thoracic region: Secondary | ICD-10-CM | POA: Diagnosis not present

## 2017-03-20 DIAGNOSIS — M791 Myalgia: Secondary | ICD-10-CM | POA: Diagnosis not present

## 2017-04-09 DIAGNOSIS — E559 Vitamin D deficiency, unspecified: Secondary | ICD-10-CM | POA: Diagnosis not present

## 2017-04-09 DIAGNOSIS — I129 Hypertensive chronic kidney disease with stage 1 through stage 4 chronic kidney disease, or unspecified chronic kidney disease: Secondary | ICD-10-CM | POA: Diagnosis not present

## 2017-04-09 DIAGNOSIS — E118 Type 2 diabetes mellitus with unspecified complications: Secondary | ICD-10-CM | POA: Diagnosis not present

## 2017-04-09 DIAGNOSIS — E785 Hyperlipidemia, unspecified: Secondary | ICD-10-CM | POA: Diagnosis not present

## 2017-04-17 DIAGNOSIS — N182 Chronic kidney disease, stage 2 (mild): Secondary | ICD-10-CM | POA: Diagnosis not present

## 2017-04-17 DIAGNOSIS — E785 Hyperlipidemia, unspecified: Secondary | ICD-10-CM | POA: Diagnosis not present

## 2017-04-17 DIAGNOSIS — E1122 Type 2 diabetes mellitus with diabetic chronic kidney disease: Secondary | ICD-10-CM | POA: Diagnosis not present

## 2017-04-17 DIAGNOSIS — I129 Hypertensive chronic kidney disease with stage 1 through stage 4 chronic kidney disease, or unspecified chronic kidney disease: Secondary | ICD-10-CM | POA: Diagnosis not present

## 2017-05-04 DIAGNOSIS — M722 Plantar fascial fibromatosis: Secondary | ICD-10-CM | POA: Diagnosis not present

## 2017-05-04 DIAGNOSIS — M7712 Lateral epicondylitis, left elbow: Secondary | ICD-10-CM | POA: Diagnosis not present

## 2017-05-04 DIAGNOSIS — M7711 Lateral epicondylitis, right elbow: Secondary | ICD-10-CM | POA: Diagnosis not present

## 2017-05-07 DIAGNOSIS — M25522 Pain in left elbow: Secondary | ICD-10-CM | POA: Diagnosis not present

## 2017-05-07 DIAGNOSIS — M25521 Pain in right elbow: Secondary | ICD-10-CM | POA: Diagnosis not present

## 2017-05-15 DIAGNOSIS — M25521 Pain in right elbow: Secondary | ICD-10-CM | POA: Diagnosis not present

## 2017-05-27 HISTORY — PX: ELBOW SURGERY: SHX618

## 2017-06-08 DIAGNOSIS — M7712 Lateral epicondylitis, left elbow: Secondary | ICD-10-CM | POA: Diagnosis not present

## 2017-06-12 DIAGNOSIS — M7712 Lateral epicondylitis, left elbow: Secondary | ICD-10-CM | POA: Diagnosis not present

## 2017-06-16 DIAGNOSIS — M25522 Pain in left elbow: Secondary | ICD-10-CM | POA: Diagnosis not present

## 2017-06-16 DIAGNOSIS — M25622 Stiffness of left elbow, not elsewhere classified: Secondary | ICD-10-CM | POA: Diagnosis not present

## 2017-06-16 DIAGNOSIS — R531 Weakness: Secondary | ICD-10-CM | POA: Diagnosis not present

## 2017-07-03 ENCOUNTER — Institutional Professional Consult (permissible substitution): Payer: Medicare HMO | Admitting: Pulmonary Disease

## 2017-07-06 DIAGNOSIS — R531 Weakness: Secondary | ICD-10-CM | POA: Diagnosis not present

## 2017-07-06 DIAGNOSIS — M25622 Stiffness of left elbow, not elsewhere classified: Secondary | ICD-10-CM | POA: Diagnosis not present

## 2017-07-06 DIAGNOSIS — M25522 Pain in left elbow: Secondary | ICD-10-CM | POA: Diagnosis not present

## 2017-07-07 DIAGNOSIS — M25522 Pain in left elbow: Secondary | ICD-10-CM | POA: Diagnosis not present

## 2017-07-15 DIAGNOSIS — R69 Illness, unspecified: Secondary | ICD-10-CM | POA: Diagnosis not present

## 2017-07-16 DIAGNOSIS — G4730 Sleep apnea, unspecified: Secondary | ICD-10-CM | POA: Diagnosis not present

## 2017-07-16 DIAGNOSIS — E785 Hyperlipidemia, unspecified: Secondary | ICD-10-CM | POA: Diagnosis not present

## 2017-07-20 DIAGNOSIS — H26493 Other secondary cataract, bilateral: Secondary | ICD-10-CM | POA: Diagnosis not present

## 2017-07-27 DIAGNOSIS — M7711 Lateral epicondylitis, right elbow: Secondary | ICD-10-CM | POA: Diagnosis not present

## 2017-07-27 DIAGNOSIS — M7712 Lateral epicondylitis, left elbow: Secondary | ICD-10-CM | POA: Diagnosis not present

## 2017-07-28 DIAGNOSIS — H26491 Other secondary cataract, right eye: Secondary | ICD-10-CM | POA: Diagnosis not present

## 2017-07-30 DIAGNOSIS — M7712 Lateral epicondylitis, left elbow: Secondary | ICD-10-CM | POA: Diagnosis not present

## 2017-07-30 DIAGNOSIS — M7711 Lateral epicondylitis, right elbow: Secondary | ICD-10-CM | POA: Diagnosis not present

## 2017-08-04 DIAGNOSIS — M7711 Lateral epicondylitis, right elbow: Secondary | ICD-10-CM | POA: Diagnosis not present

## 2017-08-04 DIAGNOSIS — M7712 Lateral epicondylitis, left elbow: Secondary | ICD-10-CM | POA: Diagnosis not present

## 2017-08-11 DIAGNOSIS — H26492 Other secondary cataract, left eye: Secondary | ICD-10-CM | POA: Diagnosis not present

## 2017-08-27 DIAGNOSIS — G4733 Obstructive sleep apnea (adult) (pediatric): Secondary | ICD-10-CM | POA: Diagnosis not present

## 2017-09-25 DIAGNOSIS — H401134 Primary open-angle glaucoma, bilateral, indeterminate stage: Secondary | ICD-10-CM | POA: Diagnosis not present

## 2017-09-26 DIAGNOSIS — G4733 Obstructive sleep apnea (adult) (pediatric): Secondary | ICD-10-CM | POA: Diagnosis not present

## 2017-10-06 DIAGNOSIS — R0609 Other forms of dyspnea: Secondary | ICD-10-CM | POA: Diagnosis not present

## 2017-10-06 DIAGNOSIS — R6889 Other general symptoms and signs: Secondary | ICD-10-CM | POA: Diagnosis not present

## 2017-10-12 ENCOUNTER — Encounter: Payer: Self-pay | Admitting: Cardiology

## 2017-10-12 ENCOUNTER — Ambulatory Visit (INDEPENDENT_AMBULATORY_CARE_PROVIDER_SITE_OTHER): Payer: Medicare HMO | Admitting: Cardiology

## 2017-10-12 VITALS — BP 150/82 | HR 52 | Ht 71.0 in | Wt 182.0 lb

## 2017-10-12 DIAGNOSIS — I208 Other forms of angina pectoris: Secondary | ICD-10-CM | POA: Diagnosis not present

## 2017-10-12 DIAGNOSIS — R351 Nocturia: Secondary | ICD-10-CM | POA: Diagnosis not present

## 2017-10-12 DIAGNOSIS — R42 Dizziness and giddiness: Secondary | ICD-10-CM | POA: Diagnosis not present

## 2017-10-12 DIAGNOSIS — R0609 Other forms of dyspnea: Secondary | ICD-10-CM | POA: Diagnosis not present

## 2017-10-12 DIAGNOSIS — N401 Enlarged prostate with lower urinary tract symptoms: Secondary | ICD-10-CM | POA: Diagnosis not present

## 2017-10-12 DIAGNOSIS — D49512 Neoplasm of unspecified behavior of left kidney: Secondary | ICD-10-CM | POA: Diagnosis not present

## 2017-10-12 NOTE — Assessment & Plan Note (Signed)
This symptom is describing while playing tennis during high humidity is probably radicular.  A little bit dehydrated.  He did not have any syncope or near syncope.  I just recommended aggressive hydration especially when he is exercising and how he could there is no signs of heart failure therefore he could tolerate hydration.

## 2017-10-12 NOTE — Assessment & Plan Note (Signed)
The dizziness and a little bit of chest discomfort associated exertional dyspnea is concerning for possible atypical symptoms for angina.  He himself does not have a significant family history as all of his parents and grandparents lived to be well into their 35s, he does carry a diagnosis of diabetes (although his A1c is 5.7).  Along with hypertension and hyperlipidemia.  Given the exercise intolerance and dyspnea along other atypical features for possible angina, reasonable to exclude CAD.  Given his age and risk factors at least moderate risk for this being ischemic related. Plan: Treadmill Myoview Stress Test

## 2017-10-12 NOTE — Patient Instructions (Signed)
NO MEDICATIONS  CHANGES   SCHEDULE  [place] has requested that you have an echocardiogram. Echocardiography is a painless test that uses sound waves to create images of your heart. It provides your doctor with information about the size and shape of your heart and how well your heart's chambers and valves are working. This procedure takes approximately one hour. There are no restrictions for this procedure.  AND [place] has requested that you have en exercise stress myoview. For further information please visit [email or web address]. Please follow instruction sheet, as given.    Your physician recommends that you schedule a follow-up appointment in MAY 2019 [place].

## 2017-10-12 NOTE — Assessment & Plan Note (Signed)
He carries a diagnosis of hypertension and is hypertensive here today, but that is very different than the pressures he had with his PCP being 127/75.  My recheck was actually also not hypertensive.  Despite this, his EKG suggests LVH. Plan: Check 2D echocardiogram to assess systolic and diastolic function concern for LVH. We will also check Myoview stress test to exclude ischemia since there is chest tightness involved.

## 2017-10-12 NOTE — Progress Notes (Signed)
PCP: Deland Pretty, MD  Clinic Note: Chief Complaint  Patient presents with  . New Patient (Initial Visit)    Consult  . Shortness of Breath    ON exertion.    HPI: Andrew Willis is a 76 y.o. male who is being seen today for the evaluation of shortness of breath on exertion at the request of Deland Pretty, MD  He has PMH listed as HTN, HLD, DM-2 & CRF listed by PCP, but is not on any BP or DM medications. His has OSA - on CPAP.    Ivy Lynn was last seen on Oct 07, 2017 by Dr. Shelia Media -noted 3 episodes of exertional dyspnea playing tennis or pickleball.  (This is been in [place] where it is quite humid).  Recent Hospitalizations: None  Studies Personally Reviewed - (if available, images/films reviewed: From Epic Chart or Care Everywhere)  None  Interval History: Andrew Willis presents today in referral by his PCP prior to returning back to [place].  He and his wife have relocated down to [place] for at least half the year but they spend 6 months out of the year up here in [place] where they raised their family.  At least 1 of their sons lives here in [place]  along with 4 of their 6 grandkids.  [name] is very active playing pickle ball, tennis, racquetball and golf as well as walking routinely.  The weather down for allows him to do this during more the year than he does here.  He has not been exercising as much here because being cold.  He does note though at least 3 occasions over the last month or so while he was exercising, once was playing tennis the other was playing pickle ball where he felt extremely fatigued and short of breath just the point where he had to stop playing.  At least 1 of the occasions he noted being quite dizzy associated with it.  He had one additional episode when he was walking around the golf course playing a round of golf and he noted again feeling very short of breath to the point where he had to stop. The episode he did where he felt dizzy associated with  shortness of breath is different than his intermittent episodes of somewhat dizziness during routine activities or even at rest that he is been having for years. When he gets short of breath with exertion he does note a little bit tightness in his chest making it difficult to breathe but it denies any pressure sensation or any chest discomfort per se.  He denies any rapid irregular heartbeats or palpitations.  Although he has some lightheadedness and dizziness, he has not any syncope or near syncope.  He sweats more than usual playing is exercise games, but thinks that it may be simply because the humidity down in [place].  He notes that that he sweats sometimes at night because his wife has the heat on.  Otherwise no hot cold intolerance. He denies any PND, orthopnea or edema.  No rapid irregular heartbeats or palpitations.  No TIA or amaurosis fugax.    No claudication.  ROS: A comprehensive was performed. Review of Systems  Constitutional: Negative for chills, fever, malaise/fatigue and weight loss.  HENT: Positive for hearing loss (Bilateral hearing aids). Negative for congestion, nosebleeds and sore throat.   Eyes: Negative for blurred vision.  Respiratory: Positive for shortness of breath (Per HPI). Negative for cough, sputum production and wheezing.   Cardiovascular:  Per HPI  Gastrointestinal: Positive for heartburn. Negative for abdominal pain, blood in stool, constipation, melena, nausea and vomiting.  Genitourinary: Negative for dysuria and frequency.  Musculoskeletal: Negative for falls, joint pain and myalgias.  Neurological: Positive for dizziness (Rare occasional dizziness.  This is a chronic thing for him.  It occurs with activity and without activity.). Negative for tingling, focal weakness, weakness and headaches.  Endo/Heme/Allergies: Negative for environmental allergies.  Psychiatric/Behavioral: Negative for depression and memory loss. The patient does not have insomnia.     All other systems reviewed and are negative.  I have reviewed and (if needed) personally updated the patient's problem list, medications, allergies, past medical and surgical history, social and family history.   Past Medical History:  Diagnosis Date  . BPH (benign prostatic hyperplasia)   . CKD (chronic kidney disease) stage 2, GFR 60-89 ml/min    Listed as diagnosis by PCP.  Patient not aware  . GERD (gastroesophageal reflux disease)   . 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 Hard of hearing    wears hearing aids  . History of diabetes mellitus 2016   Listed as a diagnosis, he does not acknowledge it and is not on medications. ->  A1c 5.7  . Hyperlipidemia    on 10 mg Atorvastatin  . OSA on CPAP   . Peyronie disease   . Primary open angle glaucoma of both eyes, indeterminate stage 04/11/2016  . Pseudophakia of both eyes 04/11/2016   Overview:  Symfony MF IOL ou  . Renal hypertension 2016   Listed as diagnosis.  He is not hypertensive and is not on medications.  . Vitamin D deficiency    Per PCP Note - also has DM-2, CRF & HTN listed - not on medications.  Past Surgical History:  Procedure Laterality Date  . APPENDECTOMY    . BASAL CELL CARCINOMA EXCISION Right    Ear lobe  . CATARACT EXTRACTION, BILATERAL    . ELBOW SURGERY Left 05/2017  . HAND SURGERY  05/2007  . ROTATOR CUFF REPAIR  1985    Current Meds  Medication Sig  . atorvastatin (LIPITOR) 10 MG tablet Take 5 mg by mouth daily.  . cholecalciferol (VITAMIN D) 1000 UNITS tablet Take 2,000 Units by mouth daily.  . finasteride (PROSCAR) 5 MG tablet Take 5 mg by mouth daily.  . timolol (BETIMOL) 0.25 % ophthalmic solution Place 1 drop into both eyes 2 (two) times daily.    No Known Allergies  Social History   Tobacco Use  . Smoking status: Never Smoker  . Smokeless tobacco: Never Used  Substance Use Topics  . Alcohol use: No    Frequency: Never  . Drug use: No   Social History   Social History Narrative   Married  father of 2 with 6 grandchildren.   He completed College.   He & his wife have relocated to [place] (for ~6 months of the year) after being in [place] for several decades.    !2-15 drinks of beer or wine/week.   He does not smoke.   He is very active, plays tennis, pickleball and racquetball as well as playing golf and walking.  (He has not been doing it recently because it is "too cold ").    family history includes COPD in his father; Colitis (age of onset: 47) in his father; Stroke (age of onset: 73) in his maternal grandmother and mother.  Wt Readings from Last 3 Encounters:  10/12/17 182 lb (82.6 kg)  PHYSICAL EXAM BP (!) 150/82   Pulse (!) 52   Ht 5\' 11"  (1.803 m)   Wt 182 lb (82.6 kg)   BMI 25.38 kg/m   -He tells me at home his blood pressure is usually are much better than they are today.  He is very upset today of having had to wait for a long period of time at his urologist office knowing that he had this appointment.  He was worried about being late and was quite rushed and flustered coming in.  On my recheck it was more of 130/80 mmHg) ( Physical Exam  Constitutional: He is oriented to person, place, and time. He appears well-developed and well-nourished. No distress.  Healthy-appearing.  Well-groomed.  HENT:  Head: Normocephalic and atraumatic.  Mouth/Throat: Oropharynx is clear and moist. No oropharyngeal exudate.  Eyes: Conjunctivae and EOM are normal. No scleral icterus.  Neck: Normal range of motion. Neck supple. No hepatojugular reflux and no JVD present. Carotid bruit is not present. No thyromegaly present.  Cardiovascular: Normal rate, regular rhythm and intact distal pulses. Exam reveals no gallop and no friction rub.  No murmur heard. Nondisplaced PMI  Pulmonary/Chest: Effort normal and breath sounds normal. No respiratory distress. He has no wheezes. He has no rales.  Abdominal: Soft. Bowel sounds are normal. He exhibits no distension. There is no  tenderness. There is no rebound.  Musculoskeletal: Normal range of motion. He exhibits no edema or deformity.  Neurological: He is alert and oriented to person, place, and time. No cranial nerve deficit.  Skin: Skin is warm and dry. No erythema.  Psychiatric: He has a normal mood and affect. His behavior is normal. Judgment and thought content normal.  Nursing note and vitals reviewed.    Adult ECG Report  Rate: 52;  Rhythm: sinus bradycardia and Voltage criteria for LVH.  Otherwise essentially normal EKG besides sinus bradycardia.;   Narrative Interpretation: Stable EKG from PCP   Other studies Reviewed: Additional studies/ records that were reviewed today include:  Recent Labs: June 2018 (PCP  Na+ 144, K+ 4.9, Cl-   107, HCO3-   29, BUN 16, Cr 0.9, Glu 96, Ca2+ 9.5, Mgt 1.9; AST 18, ALT 27,  AlkP 77,  T Bili 0.6, TP 6.7,Alb 3.7  CBC: W 6.7, H/H 14.6/43.5, Plt 200; A1c -5.7    ASSESSMENT / PLAN: Problem List Items Addressed This Visit    Atypical angina (HCC)    The dizziness and a little bit of chest discomfort associated exertional dyspnea is concerning for possible atypical symptoms for angina.  He himself does not have a significant family history as all of his parents and grandparents lived to be well into their 24s, he does carry a diagnosis of diabetes (although his A1c is 5.7).  Along with hypertension and hyperlipidemia.  Given the exercise intolerance and dyspnea along other atypical features for possible angina, reasonable to exclude CAD.  Given his age and risk factors at least moderate risk for this being ischemic related. Plan: Treadmill Myoview Stress Test      Relevant Orders   EKG 12-Lead   MYOCARDIAL PERFUSION IMAGING   ECHOCARDIOGRAM COMPLETE   DOE (dyspnea on exertion) - Primary    He carries a diagnosis of hypertension and is hypertensive here today, but that is very different than the pressures he had with his PCP being 127/75.  My recheck was actually also  not hypertensive.  Despite this, his EKG suggests LVH. Plan: Check 2D echocardiogram to assess systolic  and diastolic function concern for LVH. We will also check Myoview stress test to exclude ischemia since there is chest tightness involved.      Relevant Orders   EKG 12-Lead   MYOCARDIAL PERFUSION IMAGING   ECHOCARDIOGRAM COMPLETE   Episode of dizziness (Chronic)    This symptom is describing while playing tennis during high humidity is probably radicular.  A little bit dehydrated.  He did not have any syncope or near syncope.  I just recommended aggressive hydration especially when he is exercising and how he could there is no signs of heart failure therefore he could tolerate hydration.         Current medicines are reviewed at length with the patient today. (+/- concerns) none The following changes have been made:None  Patient Instructions  NO MEDICATIONS  CHANGES   SCHEDULE  [place] has requested that you have an echocardiogram. Echocardiography is a painless test that uses sound waves to create images of your heart. It provides your doctor with information about the size and shape of your heart and how well your heart's chambers and valves are working. This procedure takes approximately one hour. There are no restrictions for this procedure.  AND [place] has requested that you have en exercise stress myoview. For further information please visit [email or web address]. Please follow instruction sheet, as given.    Your physician recommends that you schedule a follow-up appointment in MAY 2019 [place].    Studies Ordered:   Orders Placed This Encounter  Procedures  . MYOCARDIAL PERFUSION IMAGING  . EKG 12-Lead  . ECHOCARDIOGRAM COMPLETE      Glenetta Hew, M.D., M.S. Interventional Cardiologist   Pager # [phone] Phone # [phone] [address]. [place], [place] [address]   Thank you for choosing [facility] at [facility]!!

## 2017-10-13 ENCOUNTER — Telehealth (HOSPITAL_COMMUNITY): Payer: Self-pay | Admitting: *Deleted

## 2017-10-13 NOTE — Telephone Encounter (Signed)
Close encounter 

## 2017-10-14 ENCOUNTER — Ambulatory Visit (HOSPITAL_COMMUNITY)
Admission: RE | Admit: 2017-10-14 | Discharge: 2017-10-14 | Disposition: A | Discharge status: Home / Self Care | Payer: Medicare HMO | Source: Ambulatory Visit | Attending: Cardiovascular Disease | Admitting: Cardiovascular Disease

## 2017-10-14 ENCOUNTER — Other Ambulatory Visit: Payer: Self-pay | Admitting: Urology

## 2017-10-14 DIAGNOSIS — I208 Other forms of angina pectoris: Secondary | ICD-10-CM | POA: Insufficient documentation

## 2017-10-14 DIAGNOSIS — R0609 Other forms of dyspnea: Secondary | ICD-10-CM | POA: Diagnosis not present

## 2017-10-14 DIAGNOSIS — D49512 Neoplasm of unspecified behavior of left kidney: Secondary | ICD-10-CM

## 2017-10-14 LAB — MYOCARDIAL PERFUSION IMAGING
CHL CUP NUCLEAR SRS: 0
CHL CUP NUCLEAR SSS: 4
CHL CUP RESTING HR STRESS: 52 {beats}/min
CHL RATE OF PERCEIVED EXERTION: 17
CSEPEDS: 0 s
CSEPEW: 10.1 METS
CSEPHR: 93 %
CSEPPHR: 134 {beats}/min
Exercise duration (min): 9 min
LV dias vol: 154 mL (ref 62–150)
LV sys vol: 69 mL
MPHR: 143 {beats}/min
SDS: 4
TID: 0.9

## 2017-10-14 MED ORDER — TECHNETIUM TC 99M TETROFOSMIN IV KIT
10.6000 | PACK | Freq: Once | INTRAVENOUS | Status: AC | PRN
  Administered 2017-10-14: 10.6 via INTRAVENOUS
  Filled 2017-10-14: qty 11

## 2017-10-14 MED ORDER — TECHNETIUM TC 99M TETROFOSMIN IV KIT
31.4000 | PACK | Freq: Once | INTRAVENOUS | Status: AC | PRN
  Administered 2017-10-14: 31.4 via INTRAVENOUS
  Filled 2017-10-14: qty 32

## 2017-10-15 ENCOUNTER — Telehealth: Payer: Self-pay | Admitting: *Deleted

## 2017-10-15 NOTE — Telephone Encounter (Signed)
Spoke to patient. Result given . Verbalized understanding  

## 2017-10-15 NOTE — Telephone Encounter (Signed)
-----   Message from Leonie Man, MD sent at 10/15/2017  2:30 AM EST ----- Andrew Willis news: Should feel comfortable going to [place] without much concern.  Stress test was read as LOW RISK.  Pump function is normal.  No signs of ischemia or infarction.  Glenetta Hew, MD

## 2017-10-16 ENCOUNTER — Ambulatory Visit (HOSPITAL_COMMUNITY): Payer: Medicare HMO | Attending: Cardiology

## 2017-10-16 ENCOUNTER — Other Ambulatory Visit: Payer: Self-pay

## 2017-10-16 DIAGNOSIS — I351 Nonrheumatic aortic (valve) insufficiency: Secondary | ICD-10-CM | POA: Diagnosis not present

## 2017-10-16 DIAGNOSIS — I208 Other forms of angina pectoris: Secondary | ICD-10-CM | POA: Diagnosis not present

## 2017-10-16 DIAGNOSIS — R0609 Other forms of dyspnea: Secondary | ICD-10-CM

## 2017-10-21 ENCOUNTER — Ambulatory Visit (HOSPITAL_COMMUNITY)
Admission: RE | Admit: 2017-10-21 | Discharge: 2017-10-21 | Disposition: A | Discharge status: Home / Self Care | Payer: Medicare HMO | Source: Ambulatory Visit | Attending: Urology | Admitting: Urology

## 2017-10-21 ENCOUNTER — Encounter (HOSPITAL_COMMUNITY): Payer: Self-pay

## 2017-11-02 ENCOUNTER — Ambulatory Visit: Payer: Medicare HMO | Admitting: Cardiology

## 2018-03-18 ENCOUNTER — Ambulatory Visit: Payer: Medicare HMO | Admitting: Cardiology

## 2018-05-23 IMAGING — MR MR ABDOMEN WO/W CM
10 of 19 series · 19 of 48 positions shown · IV contrast (multihance)
Comparison: 10/18/2015

CLINICAL DATA: Followup of renal lesions.  Neoplasm of left kidney.

EXAM:
MRI ABDOMEN WITHOUT AND WITH CONTRAST
TECHNIQUE: Multiplanar multisequence MR imaging of the abdomen was performed
both before and after the administration of intravenous contrast.
CONTRAST:  17 cc MultiHance

[Series 3: DWI b500 · axial · 6.0mm · 1.64mm/px · 1 of 70 slices shown]
[im 1/70]
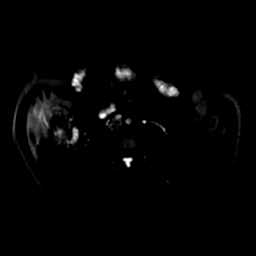

[Series 4: T2 fat-sat · axial · 5.0mm · 0.86mm/px · 1 of 57 slices shown (1 of 2)]
[im 1/57]
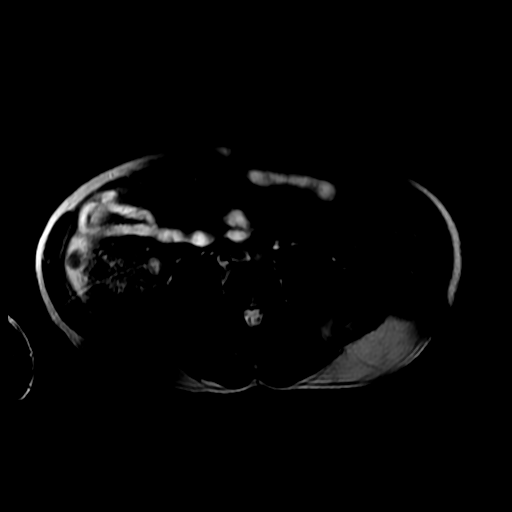

[Series 5: ax dualecho · axial · 5.0mm · 0.86mm/px · z∈[-90,+190]mm · 4 of 114 slices shown]
[im 1/114]
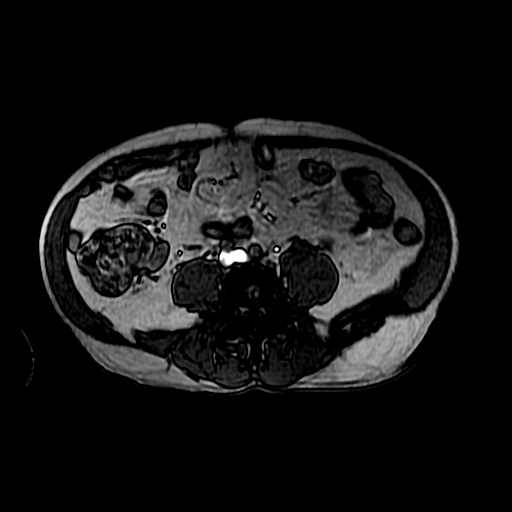
[im 38/114]
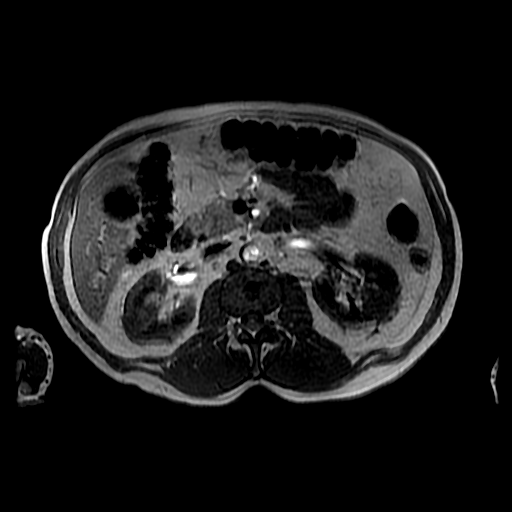
[im 76/114]
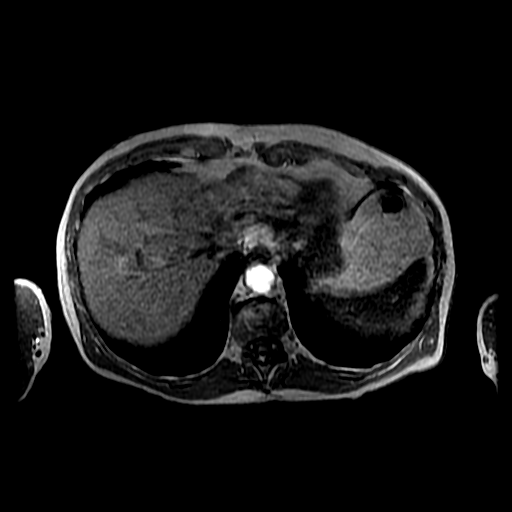
[im 114/114]
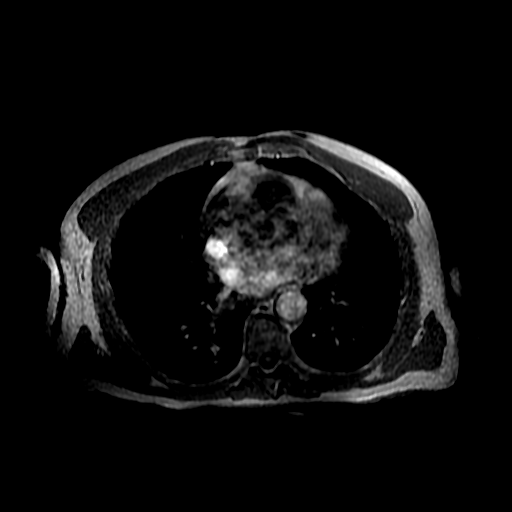

[Series 6: T2 · coronal · 5.0mm · 0.78mm/px · 2 of 47 slices shown (1 of 2)]
[im 1/47]
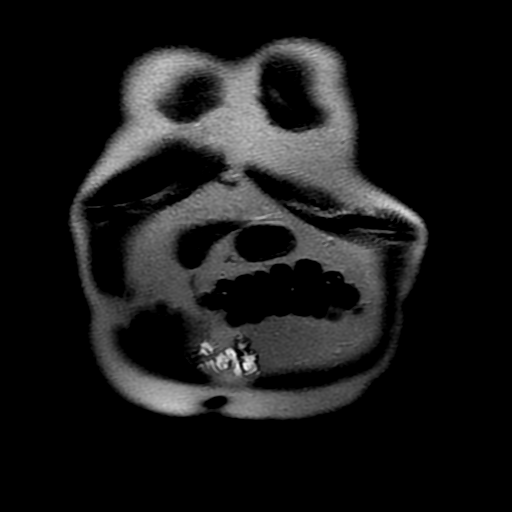
[im 47/47]
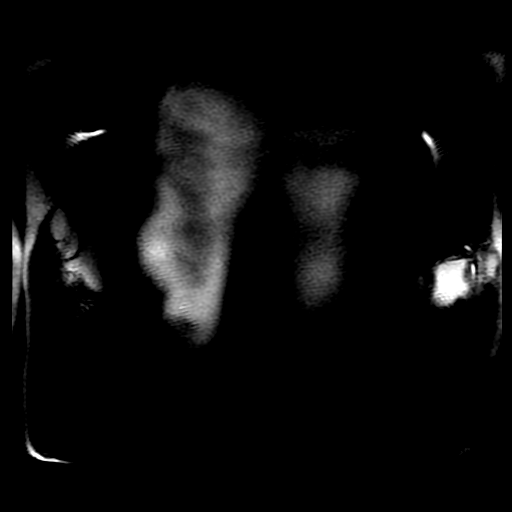

[Series 7: T2 · axial · 5.0mm · 0.86mm/px · z∈[-85,+190]mm · 2 of 56 slices shown (2 of 2)]
[im 1/56]
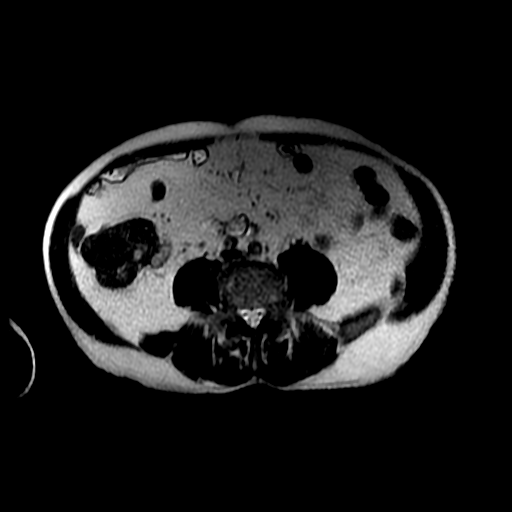
[im 56/56]
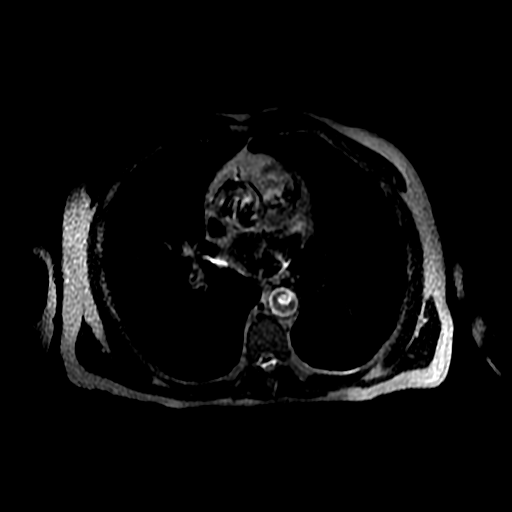

[Series 8: bSSFP · axial · 5.0mm · 0.86mm/px · z∈[-90,+190]mm · 2 of 57 slices shown]
[im 1/57]
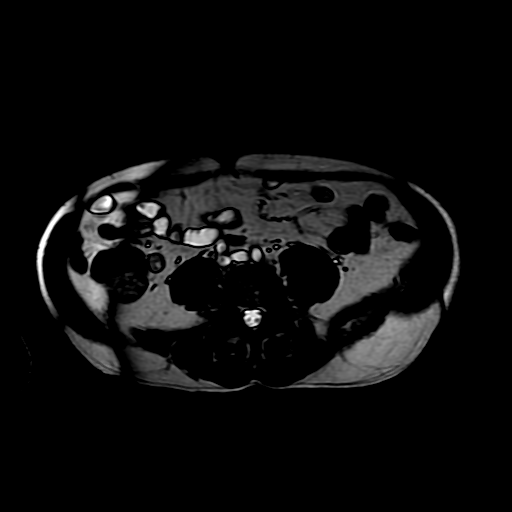
[im 57/57]
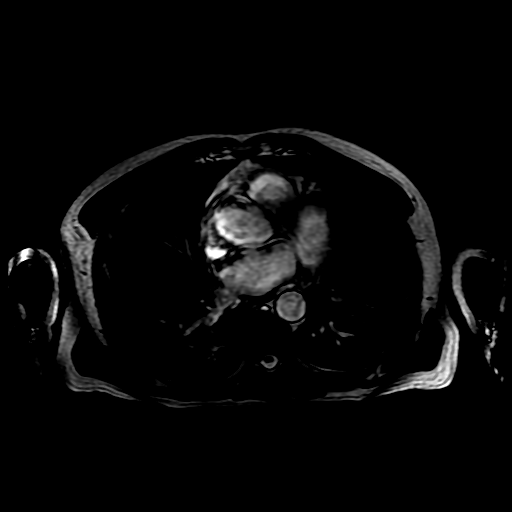

[Series 9: T2 fat-sat · axial · 5.0mm · 0.86mm/px · z∈[-90,+190]mm · 2 of 57 slices shown (2 of 2)]
[im 1/57]
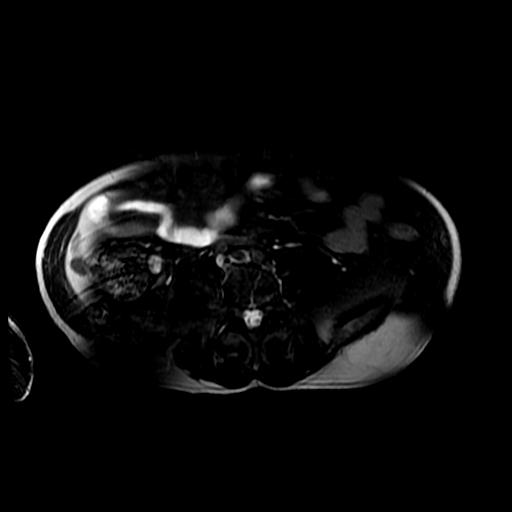
[im 57/57]
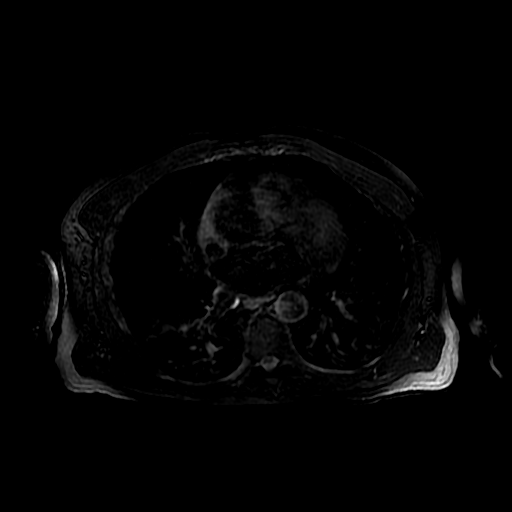

[Series 300: DWI · axial · 6.0mm · 1.64mm/px · 1 of 35 slices shown]
[im 1/35]
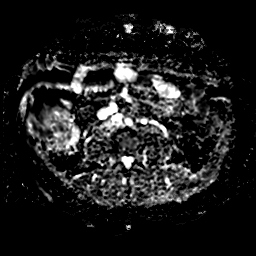

[Series 1000: T1 dynamic · axial · 5.6mm · 0.78mm/px · z∈[-108,+135]mm · 3 of 88 slices shown (1 of 2)]
[im 1/88]
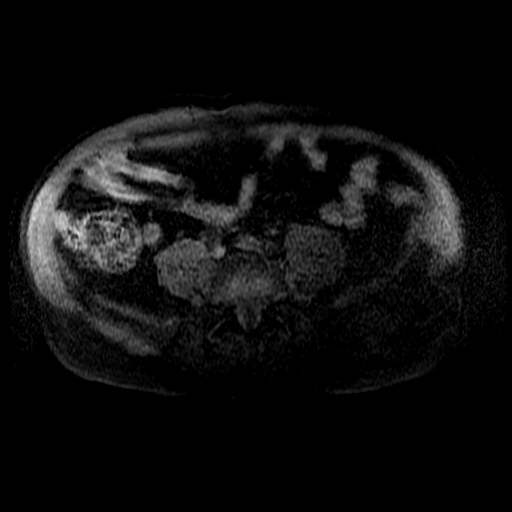
[im 44/88]
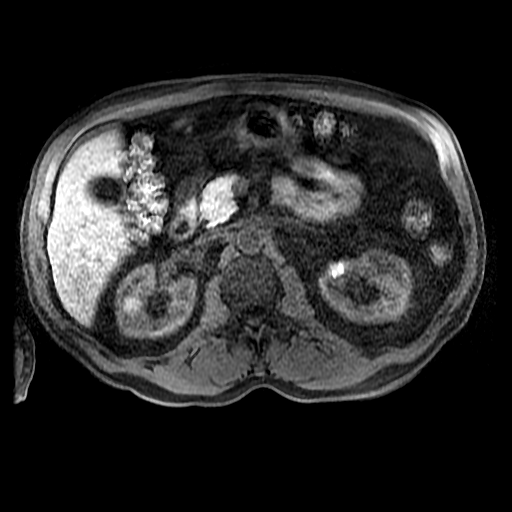
[im 88/88]
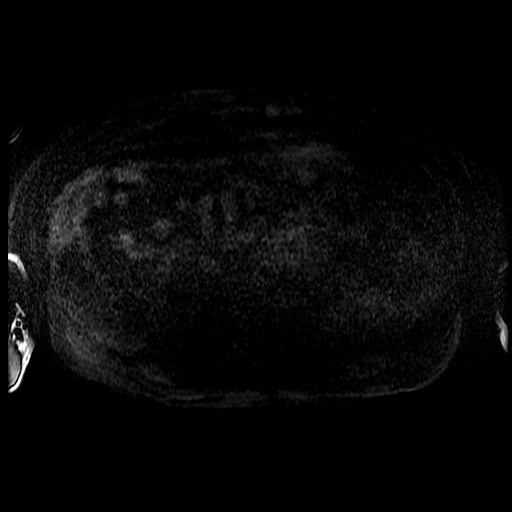

[Series 1001: T1 dynamic · axial · 5.6mm · 0.78mm/px · 1 of 88 slices shown (2 of 2)]
[im 1/88]
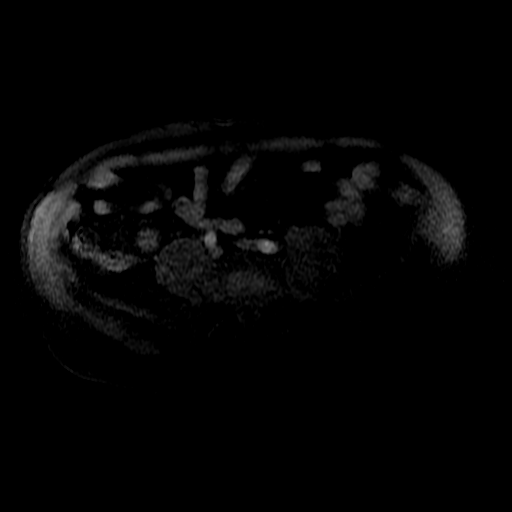

[19 of 48 positions shown; findings below may reference images not displayed]

FINDINGS: Portions of exam are mildly motion degraded.

Lower chest: Normal heart size without pericardial or pleural
effusion.

Hepatobiliary: High right hepatic lobe cyst. Stone filled
gallbladder, without acute cholecystitis or biliary duct dilatation.

Pancreas:  Normal, without mass or ductal dilatation.

Spleen:  Normal in size, without focal abnormality.

Adrenals/Urinary Tract: Normal adrenal glands. Multiple simple and
complex renal cysts are again identified. Although the subtracted
images are suboptimal secondary to motion, none of these lesions
demonstrate enhancement. Example complex cyst in the upper pole
right kidney may have enlarged minimally at 11 mm on image 30/
series 4 today versus 10 mm on the prior. A lower pole left renal
lesion has altered morphology, with interval enlargement at 2.0 cm
on image 39/ series 4 today versus 10 mm on the prior exam. The
complex component measures 1.4 cm as evidenced by T2 hypointensity
and T1 hyperintensity prior to contrast. A more simple component
superiorly is also identified. Given motion limitation, no
convincing evidence of contrast enhancement in this lesion.

The Bosniak 4 lesion detailed on the prior exam is similar in size.
Positioned within the medial interpolar left kidney. Example 9 mm on
image 27/series 11. The enhancing portion detailed on the prior exam
is not readily identified today.

No hydronephrosis.

Stomach/Bowel: Normal stomach and abdominal bowel loops.

Vascular/Lymphatic: Aortic and branch vessel atherosclerosis. Patent
renal veins. No retroperitoneal or retrocrural adenopathy.

Other:  No ascites.

Musculoskeletal: No acute osseous abnormality.
IMPRESSION: 1. Mildly motion degraded exam.
2. The suspicious lesion detailed on the prior exam, within the
interpolar left kidney medially, is unchanged in size. The enhancing
component detailed on the prior exam is not readily apparent today.
Consider surveillance with pre and post contrast abdominal MRI at 1
year.
3. Other renal lesions are favored to represent cysts and complex
cysts.
4. Cholelithiasis.

## 2019-06-14 ENCOUNTER — Other Ambulatory Visit (HOSPITAL_COMMUNITY): Payer: Self-pay | Admitting: Urology

## 2019-06-14 ENCOUNTER — Other Ambulatory Visit: Payer: Self-pay | Admitting: Urology

## 2019-06-14 DIAGNOSIS — N281 Cyst of kidney, acquired: Secondary | ICD-10-CM

## 2019-06-28 ENCOUNTER — Ambulatory Visit (HOSPITAL_COMMUNITY)
Admission: RE | Admit: 2019-06-28 | Discharge: 2019-06-28 | Disposition: A | Discharge status: Home / Self Care | Payer: Medicare PPO | Source: Ambulatory Visit | Attending: Urology | Admitting: Urology

## 2019-06-28 ENCOUNTER — Other Ambulatory Visit: Payer: Self-pay

## 2019-06-28 DIAGNOSIS — N281 Cyst of kidney, acquired: Secondary | ICD-10-CM | POA: Insufficient documentation

## 2019-06-28 LAB — POCT I-STAT CREATININE: Creatinine, Ser: 0.9 mg/dL (ref 0.61–1.24)

## 2019-06-28 MED ORDER — GADOBUTROL 1 MMOL/ML IV SOLN
8.0000 mL | Freq: Once | INTRAVENOUS | Status: AC | PRN
  Administered 2019-06-28: 8 mL via INTRAVENOUS

## 2020-06-12 ENCOUNTER — Ambulatory Visit
Admission: RE | Admit: 2020-06-12 | Discharge: 2020-06-12 | Disposition: A | Discharge status: Home / Self Care | Payer: Medicare PPO | Source: Ambulatory Visit | Attending: Family Medicine | Admitting: Family Medicine

## 2020-06-12 ENCOUNTER — Other Ambulatory Visit: Payer: Self-pay | Admitting: Family Medicine

## 2020-06-12 DIAGNOSIS — M160 Bilateral primary osteoarthritis of hip: Secondary | ICD-10-CM

## 2020-06-12 DIAGNOSIS — M545 Low back pain, unspecified: Secondary | ICD-10-CM

## 2020-06-12 DIAGNOSIS — M542 Cervicalgia: Secondary | ICD-10-CM

## 2020-06-12 DIAGNOSIS — M546 Pain in thoracic spine: Secondary | ICD-10-CM

## 2020-07-11 ENCOUNTER — Other Ambulatory Visit: Payer: Self-pay | Admitting: Urology

## 2020-07-12 NOTE — Telephone Encounter (Signed)
I don't see an appointment scheduled for this patient.   He can have refills to get him through to a f/u appointment in the next 2-3 months.

## 2022-04-16 ENCOUNTER — Other Ambulatory Visit (HOSPITAL_COMMUNITY): Payer: Self-pay | Admitting: Family Medicine

## 2022-04-16 DIAGNOSIS — I5189 Other ill-defined heart diseases: Secondary | ICD-10-CM

## 2022-05-02 ENCOUNTER — Other Ambulatory Visit (HOSPITAL_COMMUNITY): Payer: Medicare PPO

## 2023-04-10 ENCOUNTER — Other Ambulatory Visit: Payer: Self-pay | Admitting: Family Medicine

## 2023-04-10 DIAGNOSIS — R7401 Elevation of levels of liver transaminase levels: Secondary | ICD-10-CM

## 2023-04-16 ENCOUNTER — Ambulatory Visit
Admission: RE | Admit: 2023-04-16 | Discharge: 2023-04-16 | Disposition: A | Discharge status: Home / Self Care | Payer: Medicare Other | Source: Ambulatory Visit | Attending: Family Medicine | Admitting: Family Medicine

## 2023-04-16 DIAGNOSIS — R7401 Elevation of levels of liver transaminase levels: Secondary | ICD-10-CM

## 2023-04-20 ENCOUNTER — Encounter: Payer: Self-pay | Admitting: Family Medicine

## 2024-03-20 ENCOUNTER — Other Ambulatory Visit: Payer: Self-pay | Admitting: Urology

## 2024-04-12 ENCOUNTER — Other Ambulatory Visit (HOSPITAL_COMMUNITY): Payer: Self-pay | Admitting: Family Medicine

## 2024-04-12 DIAGNOSIS — I712 Thoracic aortic aneurysm, without rupture, unspecified: Secondary | ICD-10-CM

## 2024-04-13 ENCOUNTER — Ambulatory Visit (HOSPITAL_COMMUNITY)
Admission: RE | Admit: 2024-04-13 | Discharge: 2024-04-13 | Disposition: A | Discharge status: Home / Self Care | Source: Ambulatory Visit | Attending: Cardiology | Admitting: Cardiology

## 2024-04-13 DIAGNOSIS — I712 Thoracic aortic aneurysm, without rupture, unspecified: Secondary | ICD-10-CM | POA: Diagnosis present

## 2024-04-13 LAB — ECHOCARDIOGRAM COMPLETE
Area-P 1/2: 4.21 cm2
P 1/2 time: 367 ms
S' Lateral: 3.8 cm

## 2024-07-25 ENCOUNTER — Other Ambulatory Visit (HOSPITAL_COMMUNITY): Payer: Self-pay | Admitting: Medical

## 2024-07-25 DIAGNOSIS — M79661 Pain in right lower leg: Secondary | ICD-10-CM

## 2024-07-26 ENCOUNTER — Ambulatory Visit (HOSPITAL_COMMUNITY)
Admission: RE | Admit: 2024-07-26 | Discharge: 2024-07-26 | Disposition: A | Discharge status: Home / Self Care | Source: Ambulatory Visit | Attending: Vascular Surgery | Admitting: Vascular Surgery

## 2024-07-26 DIAGNOSIS — M79661 Pain in right lower leg: Secondary | ICD-10-CM | POA: Insufficient documentation
